# Patient Record
Sex: Male | Born: 1970 | Race: White | Hispanic: No | Marital: Married | Smoking: Current every day smoker
Health system: Southern US, Community
[De-identification: ages and names within clinical notes are randomized; demographics above are authoritative.]

## PROBLEM LIST (undated history)

## (undated) DIAGNOSIS — K589 Irritable bowel syndrome without diarrhea: Secondary | ICD-10-CM

## (undated) HISTORY — PX: VASECTOMY: SHX75

## (undated) HISTORY — PX: HERNIA REPAIR: SHX51

## (undated) HISTORY — PX: OTHER SURGICAL HISTORY: SHX169

---

## 2002-04-08 ENCOUNTER — Emergency Department (HOSPITAL_COMMUNITY): Admission: EM | Admit: 2002-04-08 | Discharge: 2002-04-08 | Payer: Self-pay | Admitting: Emergency Medicine

## 2002-04-09 ENCOUNTER — Emergency Department (HOSPITAL_COMMUNITY): Admission: EM | Admit: 2002-04-09 | Discharge: 2002-04-09 | Payer: Self-pay | Admitting: *Deleted

## 2006-10-01 ENCOUNTER — Encounter (INDEPENDENT_AMBULATORY_CARE_PROVIDER_SITE_OTHER): Payer: Self-pay | Admitting: Specialist

## 2006-10-01 ENCOUNTER — Ambulatory Visit (HOSPITAL_BASED_OUTPATIENT_CLINIC_OR_DEPARTMENT_OTHER): Admission: RE | Admit: 2006-10-01 | Discharge: 2006-10-01 | Payer: Self-pay | Admitting: Urology

## 2008-04-10 ENCOUNTER — Ambulatory Visit: Payer: Self-pay | Admitting: Occupational Medicine

## 2008-04-10 DIAGNOSIS — S20219A Contusion of unspecified front wall of thorax, initial encounter: Secondary | ICD-10-CM

## 2008-04-13 ENCOUNTER — Telehealth (INDEPENDENT_AMBULATORY_CARE_PROVIDER_SITE_OTHER): Payer: Self-pay | Admitting: Occupational Medicine

## 2010-12-26 NOTE — Op Note (Signed)
NAME:  Chad Moon, Chad Moon                 ACCOUNT NO.:  [id number]   MEDICAL RECORD NO.:  [id number]          PATIENT TYPE:  AMB   LOCATION:  [facility]                         FACILITY:  [facility]   PHYSICIAN:  Mark C. Vernie Ammons, M.D.  DATE OF BIRTH:  02/21/1971   DATE OF PROCEDURE:  10/01/2006  DATE OF DISCHARGE:                               OPERATIVE REPORT   PREOPERATIVE DIAGNOSES:  1. Bilateral tender vasal nodules.  2. Penile shaft skin lesion.  3. Subcutaneous penile skin lesions.   POSTOPERATIVE DIAGNOSES:  1. Bilateral tender vasal nodules.  2. Penile shaft skin lesion.  3. Subcutaneous penile skin lesions.   PROCEDURE:  Scrotal exploration, excision of bilateral vasal segments,  excision of penile skin lesion and excision of subcutaneous penile  lesions.   SURGEON:  Dr. Ihor Gully.   ANESTHESIA:  General with local supplement.   SPECIMENS:  Right and left vas segments including clips from prior  vasectomy, penile subcu lesions and penile skin lesion to pathology.   BLOOD LOSS:  Minimal.   COMPLICATIONS:  None.   INDICATIONS:  The patient is a 40 year old white male who had undergone  a previous vasectomy in April 2007.  Clips were applied at the vas and  the patient continued to have discomfort in the area where the clips  were applied.  The clips were palpable and he was tender mainly on the  right-hand side but also on the left.  This was worse with erection and  intercourse at which time he would have bilateral groin discomfort as  well.  He also notes when he has an erection there are two areas on the  penis that are firm and uncomfortable.  He has never had trauma and  recalls these have been present for about a year and half.  He is  brought to the OR today for excision of these lesions and understands  the risks, complications and alternatives.   DESCRIPTION OF OPERATION:  After informed consent, the patient was  brought to the major OR, placed on the table,  administered general  anesthesia in the supine position.  Genitalia was sterilely prepped and  draped.  In the flaccid state, I could still feel the area on the dorsum  of the penis approximately 12 o'clock position at the mid shaft.  I made  a longitudinal incision over this location and dissected down to what  appeared to be the nodule which was associated with the dorsal,  superficial vein of the penis.  I dissected this proximally and  distally, ligated it with 4-0 chromic suture and then excised the  segment including the nodule.  Repalpation revealed the nodularity was  no longer present.   I then noted the smaller nodule just to the right of midline and more  distally on the shaft.  I incised longitudinally over this location as  well and noted what appeared to be a branch of the dorsal superficial  vein of the penis. This was treated in identical fashion, isolating a  segment and by ligating this proximally and distally. There were also  two small areas of discoloration on the shaft of the penis that were  slightly raised and suspicious for possible condyloma.  These were  excised by ellipsing around these and I extended this to include the  previously made incision.  The subcu tissue and skin was then  infiltrated with 0.5% plain Marcaine and the incisions were closed with  running 4-0 chromic suture.   Attention was then directed to the right hemiscrotum.  I was able to  palpate the area of the previous vasectomy and the clip that was easily  palpable was noted.  I made a longitudinal incision over this and then  placed an Allis clamp around the vas at the location of the clips.  I  dissected proximally and distally along the vas and then ligated the vas  both proximally and distally with 3-0 silk suture.  The vas was divided  between these two silk ties and I then dissected the segment containing  the clips free from the surrounding tissue and sent this to pathology.  All  bleeding points were cauterized. I then turned my attention to the  left side where this same procedure was performed in an identical  fashion.  The vas was ligated in identical fashion and the segment  excised and also sent to pathology.  Of note there seemed to be several  clips on the left-hand side.  Bleeding points were cauterized as well  and I infiltrated the skin and subcu tissue and around the vas  proximally and distally on both sides with 0.5% plain Marcaine.  I then  closed the skin of each incision with running 4-0 chromic suture.  Neosporin and dry gauze was applied and the patient was taken to the  recovery room in stable satisfactory condition.  He tolerated the  procedure well and there were no intraoperative complications.   He will be given a prescription for Vicodin HP #28 and Keflex 500 mg  that will be taken t.i.d. for 5 days.  He will return for follow-up in  my office in 2 weeks.      Mark C. Vernie Ammons, M.D.  Electronically Signed     MCO/MEDQ  D:  10/01/2006  T:  10/01/2006  Job:  [id number]

## 2010-12-26 NOTE — Consult Note (Signed)
NAME:  Chad Moon, Chad Moon NO.:  [id number]   MEDICAL RECORD NO.:  [id number]                   PATIENT TYPE:  EMS   LOCATION:  ED                                   FACILITY:  [facility]   PHYSICIAN:  Dirk Dress. Katrinka Blazing, M.D.                DATE OF BIRTH:  [birth year]   DATE OF CONSULTATION:  DATE OF DISCHARGE:                                   CONSULTATION   HISTORY OF PRESENT ILLNESS:  A 40 year old male seen for followup in the  emergency room for evaluation of anal pain and hemorrhoid disease.  The  patient was seen in the emergency room yesterday for similar problems.  He  gives a history of having onset of diarrhea about four days ago.  With the  diarrhea, he developed some perianal swelling and had some intermittent  bleeding.  This became more severe.  The patient is employed as a long-  Secondary school teacher and has had multiple problems with hemorrhoids related  to this job.  He states that his children also have difficulty with diarrhea  and they have been febrile over the past week or so.  He resides in [place]  and is visiting here with his grandparents.  When seen in the emergency room  yesterday, he was in moderate distress with diarrhea and had some perianal  swelling.  He was given Demerol and Phenergan, started on metronidazole,  given hydrocortisone suppositories, and Vicodin for pain.  He is followed up  today for closer evaluation.  He states that he is feeling better today then  he felt from yesterday but he still has significant pain.   PAST MEDICAL HISTORY:  Totally unremarkable except as related to the history  of present illness.  He has no major medical problems.  He has no history of  surgical diseases.   PHYSICAL EXAMINATION:  VITAL SIGNS:  Vital signs were stable.  Temperature  99.3 degrees.  HEENT:  Unremarkable.  NECK:  Supple.  CHEST:  Clear.  HEART:  Regular rate and rhythm without a murmur, gallop, or rub.  ABDOMEN:  Soft  with minimal epigastric tenderness.  Good active bowel  sounds.  RECTAL:  Mild swelling of two hemorrhoidal complexes.  He has a posterior  midline anal fissure.  There is no sentinel polyp.  The remainder of his  exam is unremarkable.   LABORATORY DATA:  No labs were done today.   IMPRESSION:  1. Gastroenteritis, improving.  2. Hemorrhoid disease, chronic, with recent exacerbation.  3. Chronic anal fissure.   RECOMMENDATIONS:  The patient is to continue on his present medications.  He  is advised to continue the full course of Flagyl and he is given a refill on  the suppositories.  He is advised to use Imodium 2 mg two tablets four  times a day until his bowel movements have improved.  He is advised  not to  allow himself to become constipated.  He is advised to add more roughage and  fruits and grains to his diet and to seek the evaluation by a surgeon when  he gets back to [place].                                               Dirk Dress. Katrinka Blazing, M.D.    LCS/MEDQ  D:  04/09/2002  T:  04/10/2002  Job:  [id number]

## 2011-07-08 ENCOUNTER — Encounter: Payer: Self-pay | Admitting: Emergency Medicine

## 2011-07-08 ENCOUNTER — Emergency Department (INDEPENDENT_AMBULATORY_CARE_PROVIDER_SITE_OTHER)
Admission: EM | Admit: 2011-07-08 | Discharge: 2011-07-08 | Disposition: A | Discharge status: Home / Self Care | Payer: 59 | Source: Home / Self Care | Attending: Family Medicine | Admitting: Family Medicine

## 2011-07-08 DIAGNOSIS — S238XXA Sprain of other specified parts of thorax, initial encounter: Secondary | ICD-10-CM

## 2011-07-08 DIAGNOSIS — S29011A Strain of muscle and tendon of front wall of thorax, initial encounter: Secondary | ICD-10-CM

## 2011-07-08 DIAGNOSIS — S239XXA Sprain of unspecified parts of thorax, initial encounter: Secondary | ICD-10-CM

## 2011-07-08 DIAGNOSIS — S2341XA Sprain of ribs, initial encounter: Secondary | ICD-10-CM

## 2011-07-08 MED ORDER — CYCLOBENZAPRINE HCL 10 MG PO TABS: ORAL_TABLET | ORAL | Status: AC

## 2011-07-08 NOTE — ED Notes (Signed)
Left shoulder pain yesterday at work heard crunching sound in left neck and shoulder while pulling on a hose.Burning Pain radiates into arm, chest and left hip.

## 2011-07-12 NOTE — ED Provider Notes (Signed)
History     CSN: [id number] Arrival date & time: 07/08/2011  6:21 PM   First MD Initiated Contact with Patient 07/08/11 1833      Chief Complaint  Patient presents with  . Shoulder Pain     HPI Comments: Patient was loading a heavy fuel hose on to his truck yesterday when he felt pain in his left shoulder.  He has now gradually developed soreness in his left lateral chest whenever he moves his left shoulder.  Patient is a 40 y.o. male presenting with shoulder pain. The history is provided by the patient.  Shoulder Pain This is a new problem. The current episode started yesterday. The problem occurs constantly. The problem has been gradually worsening. Associated symptoms include shortness of breath. Pertinent negatives include no chest pain, no abdominal pain and no headaches. The symptoms are aggravated by twisting and exertion. The symptoms are relieved by nothing. Treatments tried: Aleve. The treatment provided mild relief.    History reviewed. No pertinent past medical history.  Past Surgical History  Procedure Date  . Hernia repair   . Vasectomy   . Hemroidectomy     History reviewed. No pertinent family history.  History  Substance Use Topics  . Smoking status: Current Everyday Smoker -- 1.0 packs/day for 25 years    Types: Cigarettes  . Smokeless tobacco: Not on file  . Alcohol Use: Yes      Review of Systems  Constitutional: Negative.   HENT: Negative.   Eyes: Negative.   Respiratory: Positive for chest tightness and shortness of breath. Negative for wheezing.   Cardiovascular: Negative for chest pain.  Gastrointestinal: Negative.  Negative for abdominal pain.  Genitourinary: Negative.   Musculoskeletal:       Complains of left upper back and neck pain   Skin: Negative.   Neurological: Negative for headaches.    Allergies  Review of patient's allergies indicates no known allergies.  Home Medications   Current Outpatient Rx  Name Route Sig  Dispense Refill  . DICYCLOMINE HCL 10 MG PO CAPS Oral Take 10 mg by mouth 4 (four) times daily -  before meals and at bedtime.      Marland Kitchen LOPERAMIDE HCL 2 MG PO CAPS Oral Take 2 mg by mouth 4 (four) times daily as needed.      Marland Kitchen OMEPRAZOLE 40 MG PO CPDR Oral Take 40 mg by mouth daily.      . CYCLOBENZAPRINE HCL 10 MG PO TABS  Take one tab by mouth daily at bedtime for muscle pain 15 tablet 1    BP 125/80  Pulse 83  Temp(Src) 98.6 F (37 C) (Oral)  Resp 16  Ht 5\' 8"  (1.727 m)  Wt 188 lb (85.276 kg)  BMI 28.59 kg/m2  Physical Exam  Nursing note and vitals reviewed. Constitutional: He is oriented to person, place, and time. He appears well-developed and well-nourished. No distress.  HENT:  Head: Normocephalic.  Mouth/Throat: Oropharynx is clear and moist.  Eyes: Conjunctivae are normal. Pupils are equal, round, and reactive to light.  Neck: Normal range of motion. Neck supple. No thyromegaly present.  Cardiovascular: Normal rate, regular rhythm and normal heart sounds.   Pulmonary/Chest: Effort normal and breath sounds normal. No respiratory distress. He has no wheezes. He has no rales. He exhibits tenderness.  Abdominal: Soft. There is no tenderness.  Musculoskeletal: He exhibits no edema.       Back:       There is tenderness in the left  trapezius muscle, and tenderness along medial and inferior edges of left scapula. Patient also has tenderness along left intercostal muscles.  Lymphadenopathy:    He has no cervical adenopathy.  Neurological: He is alert and oriented to person, place, and time.  Skin: Skin is warm and dry. No rash noted.  Psychiatric: He has a normal mood and affect.    ED Course  Procedures none      1. Sprain of rhomboid   2. Intercostal muscle strain       MDM   Rx given for Flexeril at bedtime.  Rib belt applied. Take two Aleve tabs every 12 hours with food.  Apply ice pack several times daily.  Wear rib belt.  Begin range of motion exercises in  about 5 days ([facility] information and instruction handout given)   Recommend followup with Sports Medicine Clinic if not improving about two weeks.       Donna Christen, MD 07/12/11 [phone]

## 2012-06-30 ENCOUNTER — Emergency Department (INDEPENDENT_AMBULATORY_CARE_PROVIDER_SITE_OTHER)
Admission: EM | Admit: 2012-06-30 | Discharge: 2012-06-30 | Disposition: A | Discharge status: Home / Self Care | Payer: 59 | Source: Home / Self Care

## 2012-06-30 ENCOUNTER — Ambulatory Visit (INDEPENDENT_AMBULATORY_CARE_PROVIDER_SITE_OTHER): Payer: 59 | Admitting: Sports Medicine

## 2012-06-30 ENCOUNTER — Encounter: Payer: Self-pay | Admitting: *Deleted

## 2012-06-30 DIAGNOSIS — M25519 Pain in unspecified shoulder: Secondary | ICD-10-CM

## 2012-06-30 DIAGNOSIS — J069 Acute upper respiratory infection, unspecified: Secondary | ICD-10-CM

## 2012-06-30 DIAGNOSIS — J449 Chronic obstructive pulmonary disease, unspecified: Secondary | ICD-10-CM

## 2012-06-30 DIAGNOSIS — M25511 Pain in right shoulder: Secondary | ICD-10-CM | POA: Insufficient documentation

## 2012-06-30 HISTORY — DX: Irritable bowel syndrome, unspecified: K58.9

## 2012-06-30 MED ORDER — PREDNISONE 50 MG PO TABS: ORAL_TABLET | ORAL | Status: DC

## 2012-06-30 MED ORDER — MELOXICAM 15 MG PO TABS: ORAL_TABLET | ORAL | Status: AC

## 2012-06-30 MED ORDER — DOXYCYCLINE HYCLATE 100 MG PO CAPS: 100.0000 mg | ORAL_CAPSULE | Freq: Two times a day (BID) | ORAL | Status: AC

## 2012-06-30 MED ORDER — METHYLPREDNISOLONE SODIUM SUCC 125 MG IJ SOLR
125.0000 mg | Freq: Once | INTRAMUSCULAR | Status: AC
  Administered 2012-06-30: 125 mg via INTRAMUSCULAR

## 2012-06-30 NOTE — ED Provider Notes (Signed)
History     CSN: [id number]  Arrival date & time 06/30/12  1518   First MD Initiated Contact with Patient 06/30/12 1526      Chief Complaint  Patient presents with  . URI  . Shoulder Pain    HPI  URI Symptoms Onset: 2-3 days  Description: rhinorrhea, nasal congestion, cough, mild SOB and wheezing  Modifying factors:  + 1.25 PPD smoker  Symptoms Nasal discharge: yes Fever: n Sore throat: no Cough: yes Wheezing: yes Ear pain: no GI symptoms: no Sick contacts: no  Red Flags  Stiff neck: no Dyspnea: mild Rash: no Swallowing difficulty: no  Sinusitis Risk Factors Headache/face pain: no Double sickening: no tooth pain: no  Allergy Risk Factors Sneezing: no Itchy scratchy throat: no Seasonal symptoms: no  Flu Risk Factors Headache: no muscle aches: no severe fatigue: no  Pt also presenting with shoulder pain. This has been a chronic issue. 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 Had similar flare of shoulder pain last year. Predominantly R sided. Aching. Pain worse with overhead movements. No radicular sxs. No distal arm numbness.    Past Medical History  Diagnosis Date  . IBS (irritable bowel syndrome)     Past Surgical History  Procedure Date  . Hernia repair   . Vasectomy   . Hemroidectomy     No family history on file.  History  Substance Use Topics  . Smoking status: Current Every Day Smoker -- 1.0 packs/day for 25 years    Types: Cigarettes  . Smokeless tobacco: Not on file  . Alcohol Use: No      Review of Systems  All other systems reviewed and are negative.    Allergies  Review of patient's allergies indicates no known allergies.  Home Medications   Current Outpatient Rx  Name  Route  Sig  Dispense  Refill  . CYCLOBENZAPRINE HCL 10 MG PO TABS      Take one tab by mouth daily at bedtime for muscle pain   15 tablet   1   . DICYCLOMINE HCL 10 MG PO CAPS   Oral   Take 10 mg by  mouth 4 (four) times daily -  before meals and at bedtime.           Marland Kitchen DOXYCYCLINE HYCLATE 100 MG PO CAPS   Oral   Take 1 capsule (100 mg total) by mouth 2 (two) times daily.   14 capsule   0   . LOPERAMIDE HCL 2 MG PO CAPS   Oral   Take 2 mg by mouth 4 (four) times daily as needed.           Marland Kitchen OMEPRAZOLE 40 MG PO CPDR   Oral   Take 40 mg by mouth daily.           Marland Kitchen PREDNISONE 50 MG PO TABS      1 tab daily x 5 days   5 tablet   0     BP 126/83  Pulse 78  Temp 98.1 F (36.7 C) (Oral)  Resp 16  Ht 5\' 8"  (1.727 m)  SpO2 98%  Physical Exam  Constitutional: He appears well-developed and well-nourished.  HENT:  Head: Normocephalic and atraumatic.  Right Ear: External ear normal.  Left Ear: External ear normal.       +nasal erythema, rhinorrhea bilaterally, + post oropharyngeal erythema    Eyes: Conjunctivae normal are normal. Pupils are equal, round,  and reactive to light.  Neck: Normal range of motion. Neck supple.  Cardiovascular: Normal rate and regular rhythm.   Pulmonary/Chest: Effort normal. He has wheezes.  Abdominal: Soft. Bowel sounds are normal.  Musculoskeletal:       + mild TTP along R post trapezius.  Shoulder: full ROM, mild pain w/ external rotation, empty can positive.   Neurological: He is alert.  Skin: Skin is warm.    ED Course  Procedures (including critical care time)  Labs Reviewed - No data to display No results found.   1. URI (upper respiratory infection)   2. Obstructive lung disease   3. Shoulder pain       MDM  Viral induced obstructive lung disease exacerbation.  Solumedrol 125mg  IM x1.  Prednisone and doxy.  Discussed infectious and resp red flags.  Discussed smoking cessation.  Will also likely need formal PFTs 4-6 weeks after resolution of current illness.   Shoulder pain:  Chronic issue.  Will consults sports medicine.  Treatment plan per sports medicine.     The patient and/or caregiver has been  counseled thoroughly with regard to treatment plan and/or medications prescribed including dosage, schedule, interactions, rationale for use, and possible side effects and they verbalize understanding. Diagnoses and expected course of recovery discussed and will return if not improved as expected or if the condition worsens. Patient and/or caregiver verbalized understanding.             Doree Albee, MD 06/30/12 [phone]

## 2012-06-30 NOTE — Assessment & Plan Note (Signed)
For decades, highly suggestive of supraspinatus tendinopathy. Mobic, formal physical therapy. Home exercises. He'll see me back in 4 weeks, injection if no better.

## 2012-06-30 NOTE — Progress Notes (Signed)
SPORTS MEDICINE CONSULTATION REPORT  Subjective:    I'm seeing this patient as a consultation for:  Dr. Alvester Morin  CC: Right shoulder pain  HPI: This is a very pleasant 41 year old male who comes in with a decades of pain that he localizes in his right shoulder over the deltoid. He notes it's worse when reaching overhead, wakes him up at night, is localized, does not radiate, no pain in his neck, no numbness or tingling in the fingertips. It is moderate, and interferes with his activities of daily living.  Past medical history, Surgical history, Family history, Social history, Allergies, and medications have been entered into the medical record, reviewed, and no changes needed.   Review of Systems: No headache, visual changes, nausea, vomiting, diarrhea, constipation, dizziness, abdominal pain, skin rash, fevers, chills, night sweats, weight loss, swollen lymph nodes, body aches, joint swelling, muscle aches, chest pain, or shortness of breath.   Objective:   Vitals:  Afebrile, vital signs stable. General: Well Developed, well nourished, and in no acute distress.  Neuro/Psych: Alert and oriented x3, extra-ocular muscles intact, able to move all 4 extremities.  Skin: Warm and dry, no rashes noted.  Respiratory: Not using accessory muscles, speaking in full sentences, trachea midline.  Cardiovascular: Pulses palpable, no extremity edema. Abdomen: Does not appear distended. Right Shoulder: Inspection reveals no abnormalities, atrophy or asymmetry. Palpation is normal with no tenderness over AC joint or bicipital groove. ROM is full in all planes. Rotator cuff strength weak to supraspinatus. Positive Neer's, Hawkin's, and Empty Can tests. Speeds and Yergason's tests normal. No labral pathology noted with negative Obrien's, negative clunk and good stability. Normal scapular function observed. No painful arc and no drop arm sign. No apprehension sign  Impression and Recommendations:   This  case required medical decision making of moderate complexity.

## 2012-06-30 NOTE — ED Notes (Signed)
Patient c/o sneezing, sinus drainage x 2 days. Denies fever. He also c/o right shoulder and upper back pain, chronic. I told him sports Med will be the most appropriate to follow him for his muskuloseletal issues.

## 2012-07-15 ENCOUNTER — Ambulatory Visit: Payer: 59 | Attending: Sports Medicine | Admitting: Physical Therapy

## 2012-07-15 DIAGNOSIS — IMO0001 Reserved for inherently not codable concepts without codable children: Secondary | ICD-10-CM | POA: Insufficient documentation

## 2012-07-15 DIAGNOSIS — M6281 Muscle weakness (generalized): Secondary | ICD-10-CM | POA: Insufficient documentation

## 2012-07-15 DIAGNOSIS — M2550 Pain in unspecified joint: Secondary | ICD-10-CM | POA: Insufficient documentation

## 2012-07-21 ENCOUNTER — Ambulatory Visit: Payer: 59 | Admitting: Physical Therapy

## 2012-07-25 ENCOUNTER — Ambulatory Visit: Payer: 59 | Admitting: Physical Therapy

## 2012-07-28 ENCOUNTER — Ambulatory Visit: Payer: 59 | Admitting: Physical Therapy

## 2012-08-01 ENCOUNTER — Ambulatory Visit: Payer: 59 | Admitting: Physical Therapy

## 2012-08-04 ENCOUNTER — Encounter: Payer: 59 | Admitting: Physical Therapy

## 2012-08-08 ENCOUNTER — Ambulatory Visit: Payer: 59 | Admitting: Physical Therapy

## 2012-08-17 ENCOUNTER — Encounter: Payer: 59 | Admitting: Physical Therapy

## 2012-08-18 ENCOUNTER — Encounter: Payer: 59 | Admitting: Physical Therapy

## 2012-08-19 ENCOUNTER — Ambulatory Visit (HOSPITAL_COMMUNITY)
Admission: RE | Admit: 2012-08-19 | Discharge: 2012-08-19 | Disposition: A | Discharge status: Home / Self Care | Payer: 59 | Source: Ambulatory Visit | Attending: General Surgery | Admitting: General Surgery

## 2012-08-19 ENCOUNTER — Other Ambulatory Visit (HOSPITAL_COMMUNITY): Payer: Self-pay | Admitting: General Surgery

## 2012-08-19 DIAGNOSIS — R1011 Right upper quadrant pain: Secondary | ICD-10-CM

## 2012-08-25 ENCOUNTER — Encounter: Payer: 59 | Admitting: Physical Therapy

## 2012-09-01 ENCOUNTER — Other Ambulatory Visit (HOSPITAL_COMMUNITY): Payer: Self-pay | Admitting: General Surgery

## 2012-09-01 ENCOUNTER — Ambulatory Visit: Payer: 59 | Attending: Sports Medicine | Admitting: Physical Therapy

## 2012-09-01 DIAGNOSIS — R110 Nausea: Secondary | ICD-10-CM

## 2012-09-01 DIAGNOSIS — M6281 Muscle weakness (generalized): Secondary | ICD-10-CM | POA: Insufficient documentation

## 2012-09-01 DIAGNOSIS — R109 Unspecified abdominal pain: Secondary | ICD-10-CM

## 2012-09-01 DIAGNOSIS — M2550 Pain in unspecified joint: Secondary | ICD-10-CM | POA: Insufficient documentation

## 2012-09-01 DIAGNOSIS — IMO0001 Reserved for inherently not codable concepts without codable children: Secondary | ICD-10-CM | POA: Insufficient documentation

## 2012-09-05 ENCOUNTER — Encounter (HOSPITAL_COMMUNITY): Payer: Self-pay

## 2012-09-05 ENCOUNTER — Ambulatory Visit (HOSPITAL_COMMUNITY)
Admission: RE | Admit: 2012-09-05 | Discharge: 2012-09-05 | Disposition: A | Discharge status: Home / Self Care | Payer: 59 | Source: Ambulatory Visit | Attending: General Surgery | Admitting: General Surgery

## 2012-09-05 DIAGNOSIS — R110 Nausea: Secondary | ICD-10-CM | POA: Insufficient documentation

## 2012-09-05 DIAGNOSIS — R109 Unspecified abdominal pain: Secondary | ICD-10-CM

## 2012-09-05 MED ORDER — TECHNETIUM TC 99M MEBROFENIN IV KIT
5.0000 | PACK | Freq: Once | INTRAVENOUS | Status: AC | PRN
  Administered 2012-09-05: 5 via INTRAVENOUS

## 2012-09-08 ENCOUNTER — Ambulatory Visit: Payer: 59 | Admitting: Physical Therapy

## 2012-11-03 ENCOUNTER — Other Ambulatory Visit: Payer: Self-pay | Admitting: Sports Medicine

## 2012-11-18 ENCOUNTER — Other Ambulatory Visit (HOSPITAL_COMMUNITY): Payer: Self-pay | Admitting: General Surgery

## 2012-11-18 DIAGNOSIS — R197 Diarrhea, unspecified: Secondary | ICD-10-CM

## 2012-11-18 DIAGNOSIS — R109 Unspecified abdominal pain: Secondary | ICD-10-CM

## 2012-11-22 ENCOUNTER — Ambulatory Visit (HOSPITAL_COMMUNITY)
Admission: RE | Admit: 2012-11-22 | Discharge: 2012-11-22 | Disposition: A | Discharge status: Home / Self Care | Payer: 59 | Source: Ambulatory Visit | Attending: General Surgery | Admitting: General Surgery

## 2012-11-22 DIAGNOSIS — R197 Diarrhea, unspecified: Secondary | ICD-10-CM

## 2012-11-22 DIAGNOSIS — R109 Unspecified abdominal pain: Secondary | ICD-10-CM | POA: Insufficient documentation

## 2012-11-22 DIAGNOSIS — R933 Abnormal findings on diagnostic imaging of other parts of digestive tract: Secondary | ICD-10-CM | POA: Insufficient documentation

## 2012-11-22 MED ORDER — IOHEXOL 300 MG/ML  SOLN
100.0000 mL | Freq: Once | INTRAMUSCULAR | Status: AC | PRN
  Administered 2012-11-22: 100 mL via INTRAVENOUS

## 2012-11-25 ENCOUNTER — Other Ambulatory Visit (HOSPITAL_COMMUNITY): Payer: 59

## 2012-11-29 ENCOUNTER — Encounter: Payer: Self-pay | Admitting: *Deleted

## 2012-11-29 ENCOUNTER — Emergency Department (INDEPENDENT_AMBULATORY_CARE_PROVIDER_SITE_OTHER): Payer: 59

## 2012-11-29 ENCOUNTER — Emergency Department (INDEPENDENT_AMBULATORY_CARE_PROVIDER_SITE_OTHER)
Admission: EM | Admit: 2012-11-29 | Discharge: 2012-11-29 | Disposition: A | Discharge status: Home / Self Care | Payer: 59 | Source: Home / Self Care | Attending: Family Medicine | Admitting: Family Medicine

## 2012-11-29 DIAGNOSIS — R0989 Other specified symptoms and signs involving the circulatory and respiratory systems: Secondary | ICD-10-CM

## 2012-11-29 DIAGNOSIS — R490 Dysphonia: Secondary | ICD-10-CM

## 2012-11-29 DIAGNOSIS — J309 Allergic rhinitis, unspecified: Secondary | ICD-10-CM

## 2012-11-29 DIAGNOSIS — J209 Acute bronchitis, unspecified: Secondary | ICD-10-CM

## 2012-11-29 DIAGNOSIS — J3089 Other allergic rhinitis: Secondary | ICD-10-CM

## 2012-11-29 DIAGNOSIS — R059 Cough, unspecified: Secondary | ICD-10-CM

## 2012-11-29 DIAGNOSIS — R05 Cough: Secondary | ICD-10-CM

## 2012-11-29 MED ORDER — FLUTICASONE PROPIONATE 50 MCG/ACT NA SUSP: 2.0000 | Freq: Every day | NASAL | Status: AC

## 2012-11-29 MED ORDER — DOXYCYCLINE HYCLATE 100 MG PO CAPS: 100.0000 mg | ORAL_CAPSULE | Freq: Two times a day (BID) | ORAL | Status: AC

## 2012-11-29 NOTE — ED Provider Notes (Signed)
History     CSN: [id number]  Arrival date & time 11/29/12  1733   First MD Initiated Contact with Patient 11/29/12 1758      Chief Complaint  Patient presents with  . Nasal Congestion  . Headache      HPI Comments: Patient complains of onset of sneezing and sinus congestion about one week ago, followed by a mild sore throat.  Three days ago he developed myalgias, increased congestion, frontal headache, fatigue, and chills.   He has now developed a mild cough.  He also complains of persistent hoarseness for about 6 months that has actually improved slightly over the past several days.  He continues to smoke.  The history is provided by the patient.    Past Medical History  Diagnosis Date  . IBS (irritable bowel syndrome)     Past Surgical History  Procedure Laterality Date  . Hernia repair    . Vasectomy    . Hemroidectomy      History reviewed. No pertinent family history.  History  Substance Use Topics  . Smoking status: Current Every Day Smoker -- 1.00 packs/day for 25 years    Types: Cigarettes  . Smokeless tobacco: Not on file  . Alcohol Use: No      Review of Systems + sore throat + cough No pleuritic pain No wheezing + nasal congestion + post-nasal drainage + sinus pain/pressure No itchy/red eyes No earache No hemoptysis No SOB No fever, + chills No nausea No vomiting No abdominal pain No diarrhea No urinary symptoms No skin rashes + fatigue No  myalgias + headache + hoarseness Used OTC meds without relief  Allergies  Review of patient's allergies indicates no known allergies.  Home Medications   Current Outpatient Rx  Name  Route  Sig  Dispense  Refill  . cyclobenzaprine (FLEXERIL) 10 MG tablet      Take one tab by mouth daily at bedtime for muscle pain   15 tablet   1   . dicyclomine (BENTYL) 10 MG capsule   Oral   Take 10 mg by mouth 4 (four) times daily -  before meals and at bedtime.           Marland Kitchen doxycycline (VIBRAMYCIN) 100 MG capsule   Oral   Take 1 capsule (100 mg total) by mouth 2 (two) times daily.   20 capsule   0   . loperamide (IMODIUM) 2 MG capsule   Oral   Take 2 mg by mouth 4 (four) times daily as needed.           . meloxicam (MOBIC) 15 MG tablet      One tab PO qAM with breakfast for 2 weeks, then daily prn pain.   30 tablet   3   . omeprazole (PRILOSEC) 40 MG capsule   Oral   Take 40 mg by mouth daily.           . predniSONE (DELTASONE) 50 MG tablet      1 tab daily x 5 days   5 tablet   0     BP 138/86  Pulse 91  Temp(Src) 98.7 F (37.1 C) (Oral)  Ht 5' 8.5" (1.74 m)  Wt 185 lb (83.915 kg)  BMI 27.72 kg/m2  SpO2 97%  Physical Exam Nursing notes and Vital Signs reviewed. Appearance:  Patient appears healthy, stated age, and in no acute distress Eyes:  Pupils are equal, round, and reactive to light and accomodation.  Extraocular movement is intact.  Conjunctivae are not inflamed  Ears:  Canals normal.  Tympanic membranes normal.  Nose:  Mildly congested turbinates.  No sinus tenderness.  Pharynx:  Normal Neck:  Supple.  Slightly tender shotty posterior nodes are palpated bilaterally  Lungs:  Rhonchi heard posteriorly.  Breath sounds are equal.  Heart:  Regular rate and rhythm without murmurs, rubs, or gallops.  Abdomen:  Nontender without masses or hepatosplenomegaly.  Bowel sounds are present.  No CVA or flank tenderness.  Extremities:  No edema.  No calf  tenderness Skin:  No rash present.    ED Course  Procedures  none   Dg Chest 2 View  11/29/2012  *RADIOLOGY REPORT*  Clinical Data: Cough and congestion.  CHEST - 2 VIEW  Comparison: 04/10/2008 chest radiograph  Findings: The cardiomediastinal silhouette is unremarkable. The lungs are clear. COPD/emphysema noted. There is no evidence of focal airspace disease, pulmonary edema, suspicious pulmonary nodule/mass, pleural effusion, or pneumothorax. No acute bony abnormalities are identified.  IMPRESSION: No evidence of active cardiopulmonary disease.  COPD/emphysema.   Original Report Authenticated By: Harmon Pier, M.D.      1. Hoarseness, persistent   2. Acute bronchitis   3. Perennial allergic rhinitis       MDM  Begin doxycycline, and Fluticasone nasal spray.  May resume Tessalon at bedtime prn Take Mucinex D (guaifenesin with decongestant) twice daily for congestion.  Increase fluid intake. May use Afrin nasal spray (or generic oxymetazoline) twice daily for about 5 days.  Also recommend using saline nasal spray several times daily and saline nasal irrigation (AYR is a common brand).  Use prescription nose spray after using Afrin and saline rinse. Stop all antihistamines for now, and other non-prescription cough/cold preparations. Followup with ENT as soon as possible for hoarseness.        Lattie Haw, MD 12/03/12 [phone]

## 2012-11-29 NOTE — ED Notes (Signed)
Pt c/o yellow/green nasal congestion, HA and sinus pressure x 6 days. He reports having a fever yesterday. He has taken Tylenol.

## 2012-12-02 ENCOUNTER — Ambulatory Visit (HOSPITAL_COMMUNITY)
Admission: RE | Admit: 2012-12-02 | Discharge: 2012-12-02 | Disposition: A | Discharge status: Home / Self Care | Payer: 59 | Source: Ambulatory Visit | Attending: General Surgery | Admitting: General Surgery

## 2012-12-02 DIAGNOSIS — R197 Diarrhea, unspecified: Secondary | ICD-10-CM | POA: Insufficient documentation

## 2012-12-02 DIAGNOSIS — R109 Unspecified abdominal pain: Secondary | ICD-10-CM | POA: Insufficient documentation

## 2013-12-28 ENCOUNTER — Emergency Department (HOSPITAL_BASED_OUTPATIENT_CLINIC_OR_DEPARTMENT_OTHER)
Admission: EM | Admit: 2013-12-28 | Discharge: 2013-12-28 | Disposition: A | Discharge status: Home / Self Care | Payer: 59 | Attending: Emergency Medicine | Admitting: Emergency Medicine

## 2013-12-28 ENCOUNTER — Encounter (HOSPITAL_BASED_OUTPATIENT_CLINIC_OR_DEPARTMENT_OTHER): Payer: Self-pay | Admitting: Emergency Medicine

## 2013-12-28 ENCOUNTER — Emergency Department (HOSPITAL_BASED_OUTPATIENT_CLINIC_OR_DEPARTMENT_OTHER): Payer: 59

## 2013-12-28 DIAGNOSIS — Z79899 Other long term (current) drug therapy: Secondary | ICD-10-CM | POA: Insufficient documentation

## 2013-12-28 DIAGNOSIS — Z792 Long term (current) use of antibiotics: Secondary | ICD-10-CM | POA: Insufficient documentation

## 2013-12-28 DIAGNOSIS — S8012XA Contusion of left lower leg, initial encounter: Secondary | ICD-10-CM

## 2013-12-28 DIAGNOSIS — X58XXXA Exposure to other specified factors, initial encounter: Secondary | ICD-10-CM | POA: Insufficient documentation

## 2013-12-28 DIAGNOSIS — IMO0002 Reserved for concepts with insufficient information to code with codable children: Secondary | ICD-10-CM | POA: Insufficient documentation

## 2013-12-28 DIAGNOSIS — S8010XA Contusion of unspecified lower leg, initial encounter: Secondary | ICD-10-CM | POA: Insufficient documentation

## 2013-12-28 DIAGNOSIS — F17200 Nicotine dependence, unspecified, uncomplicated: Secondary | ICD-10-CM | POA: Insufficient documentation

## 2013-12-28 DIAGNOSIS — Y9389 Activity, other specified: Secondary | ICD-10-CM | POA: Insufficient documentation

## 2013-12-28 DIAGNOSIS — K589 Irritable bowel syndrome without diarrhea: Secondary | ICD-10-CM | POA: Insufficient documentation

## 2013-12-28 DIAGNOSIS — Y929 Unspecified place or not applicable: Secondary | ICD-10-CM | POA: Insufficient documentation

## 2013-12-28 DIAGNOSIS — Z791 Long term (current) use of non-steroidal anti-inflammatories (NSAID): Secondary | ICD-10-CM | POA: Insufficient documentation

## 2013-12-28 NOTE — ED Provider Notes (Signed)
CSN: [id number]     Arrival date & time 12/28/13  1753 History   First MD Initiated Contact with Patient 12/28/13 1754     This chart was scribed for non-physician practitioner, Cheron SchaumannLeslie Sofia PA-C working with Layla MawKristen N Ward, DO by Arlan OrganAshley Leger, ED Scribe. This patient was seen in room MH05/MH05 and the patient's care was started at 6:24 PM.  Chief Complaint  Patient presents with  . Leg Pain   The history is provided by the patient.    HPI Comments: Chad Moon is a 43 y.o. male with a PMHx of IBS who presents to the Emergency Department complaining of a painful area of ecchymosis to the upper posterior L lower extremity x 1 day that is progressively worsening. Pt describes this area as "burning" and states he has also noted a "knot" in the same area. He states this pain radiates into the L lower extremity intermittently. Pt also admits to moderate pain to the L calf. He has not tried anything OTC or any home remedies for his symptoms. At this time he denies any fever, chills, weakness, numbness, tingling, or loss of sensation. 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 States last drive was yesterday lasting 8 hours. Pt has a known allergy to Percocet. No other pertinent past medical history. No other concerns this visit.   Past Medical History  Diagnosis Date  . IBS (irritable bowel syndrome)    Past Surgical History  Procedure Laterality Date  . Hernia repair    . Vasectomy    . Hemroidectomy     No family history on file. History  Substance Use Topics  . Smoking status: Current Every Day Smoker -- 1.00 packs/day for 25 years    Types: Cigarettes  . Smokeless tobacco: Not on file  . Alcohol Use: No    Review of Systems  Constitutional: Negative for fever and chills.  HENT: Negative for congestion.   Musculoskeletal: Positive for arthralgias (L lower extremity).  Neurological: Negative for weakness and numbness.   All other systems reviewed and are negative.     Allergies  Percocet  Home Medications   Prior to Admission medications   Medication Sig Start Date End Date Taking? Authorizing Provider  cyclobenzaprine (FLEXERIL) 10 MG tablet Take one tab by mouth daily at bedtime for muscle pain 07/08/11   Lattie HawStephen A Beese, MD  dicyclomine (BENTYL) 10 MG capsule Take 10 mg by mouth 4 (four) times daily -  before meals and at bedtime.      Historical Provider, MD  doxycycline (VIBRAMYCIN) 100 MG capsule Take 1 capsule (100 mg total) by mouth 2 (two) times daily. 11/29/12   Lattie HawStephen A Beese, MD  fluticasone (FLONASE) 50 MCG/ACT nasal spray Place 2 sprays into the nose daily. 11/29/12   Lattie HawStephen A Beese, MD  loperamide (IMODIUM) 2 MG capsule Take 2 mg by mouth 4 (four) times daily as needed.      Historical Provider, MD  meloxicam (MOBIC) 15 MG tablet One tab PO qAM with breakfast for 2 weeks, then daily prn pain. 06/30/12   Monica Bectonhomas J Thekkekandam, MD  omeprazole (PRILOSEC) 40 MG capsule Take 40 mg by mouth daily.      Historical Provider, MD   Triage Vitals: There were no vitals taken for this visit.   Physical Exam  Nursing note and vitals reviewed. Constitutional: He is oriented to person, place, and time. He appears well-developed and well-nourished.  HENT:  Head: Normocephalic and atraumatic.  Eyes: EOM are normal. Pupils are equal, round, and reactive to light.  Neck: Normal range of motion.  Cardiovascular: Normal rate and intact distal pulses.   Good pulse in foot  Pulmonary/Chest: Effort normal.  Abdominal: He exhibits no distension.  Musculoskeletal: Normal range of motion. He exhibits tenderness.  Tenderness to palpation over L upper posterior lower extremity  Neurological: He is alert and oriented to person, place, and time.  Skin: Skin is warm and dry.  10 x 8 cm bruise to the upper posterior L lower extremity  Psychiatric: He has a normal mood and affect.    ED Course  Procedures  (including critical care time)  COORDINATION OF CARE: 6:34 PM- Will order US Venous doppler unilateral L. Discussed treatment plan with pt at bedside and pt agreed to plan.     Labs Review Labs Reviewed - No data to display  Imaging Review No results found.   EKG Interpretation None      MDM doppler no dvt   Final diagnoses:  Contusion of left leg     I personally performed the services in this documentation, which was scribed in my presence.  The recorded information has been reviewed and considered.   Barnet PallKaren SofiaPAC.  I personally performed the services described in this documentation, which was scribed in my presence. The recorded information has been reviewed and is accurate.    Lonia SkinnerLeslie K [id number], PA-C 12/28/13 2120

## 2013-12-28 NOTE — Discharge Instructions (Signed)

## 2013-12-28 NOTE — ED Provider Notes (Signed)
Medical screening examination/treatment/procedure(s) were performed by non-physician practitioner and as supervising physician I was immediately available for consultation/collaboration.   EKG Interpretation None        Layla MawKristen N Rabia Argote, DO 12/28/13 2308

## 2013-12-28 NOTE — ED Notes (Signed)
Patient is resting comfortably. Back from U/S

## 2013-12-28 NOTE — ED Notes (Signed)
Patient transported to Ultrasound 

## 2013-12-28 NOTE — ED Notes (Signed)
Karen Sofia, PA-C at bedside 

## 2013-12-28 NOTE — ED Notes (Signed)
Pt. Reports he has a knot on the back of his leg and is having some pain in the area of concern.  Pt. Reports he sits on his job.

## 2014-05-07 IMAGING — CT CT ABD-PELV W/ CM
2 of 4 series · 16 of 46 positions shown, 18 images · IV contrast (Omnipaque 300)
Comparison: None

CLINICAL DATA: Abdominal pain with diarrhea

CT ABDOMEN AND PELVIS WITH CONTRAST
TECHNIQUE: Multidetector CT imaging of the abdomen and pelvis was
performed following the standard protocol during bolus
administration of intravenous contrast.
Contrast: 100mL OMNIPAQUE IOHEXOL 300 MG/ML  SOLN

[Series 2: abd_pel_with 5.0 b40f · axial · 0.75mm/px · z∈[-461,-61]mm · 13 of 90 slices shown, 15 images]
[im 5/90  soft-tissue]
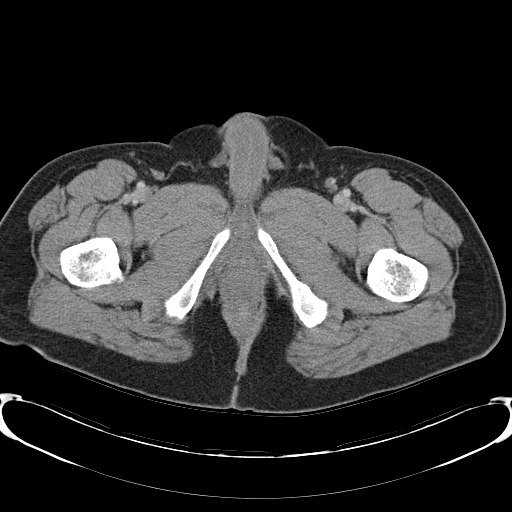
[im 5/90  bone]
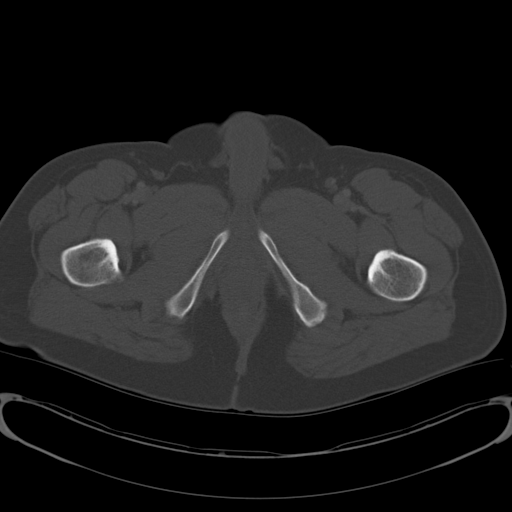
[im 13/90  soft-tissue]
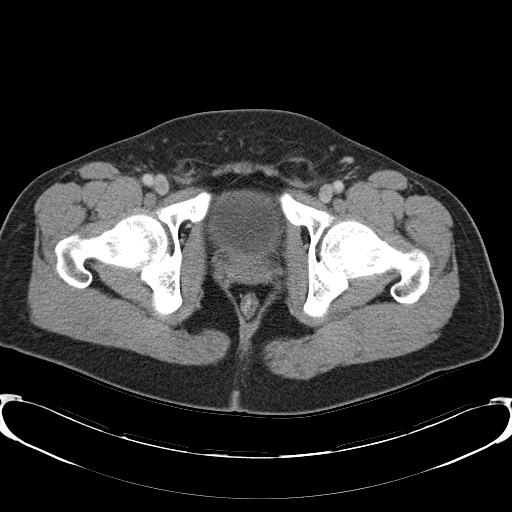
[im 17/90  soft-tissue]
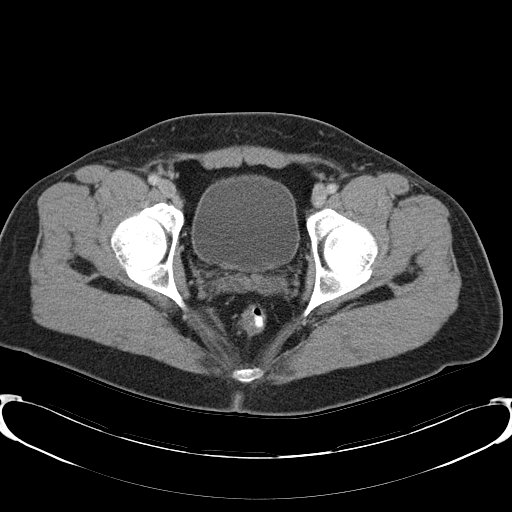
[im 26/90  soft-tissue]
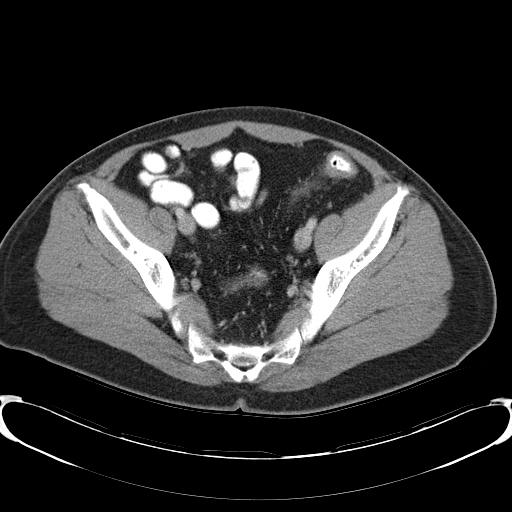
[im 30/90  soft-tissue]
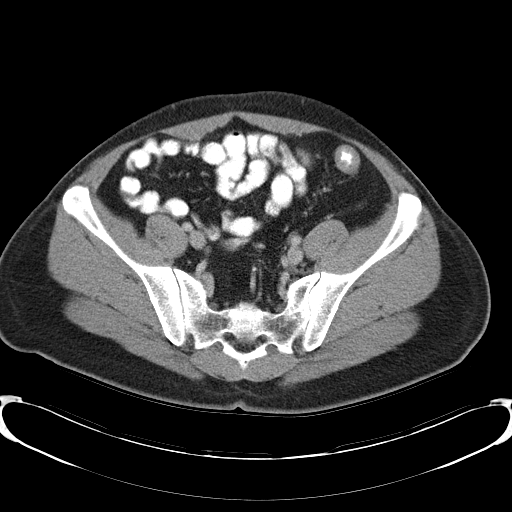
[im 39/90  soft-tissue]
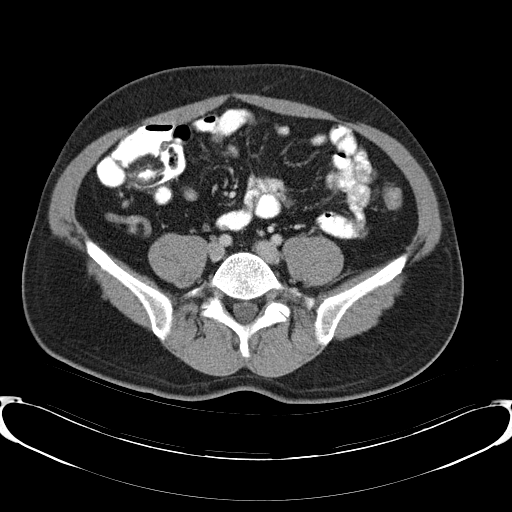
[im 47/90  soft-tissue]
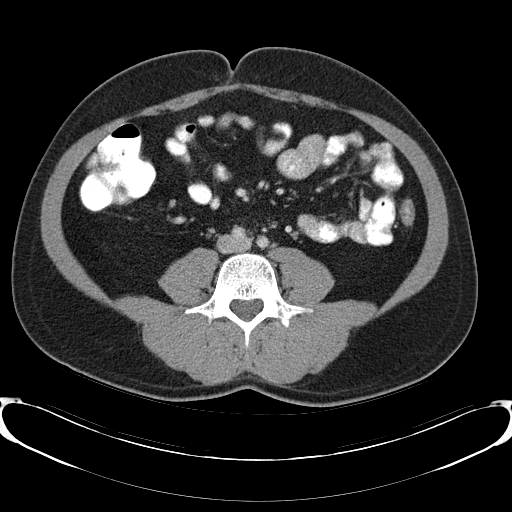
[im 51/90  soft-tissue]
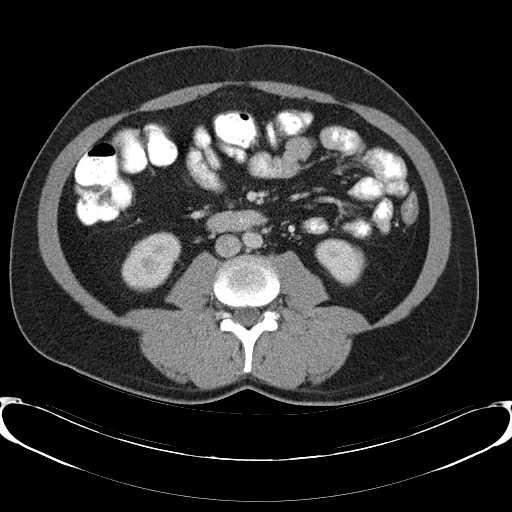
[im 60/90  soft-tissue]
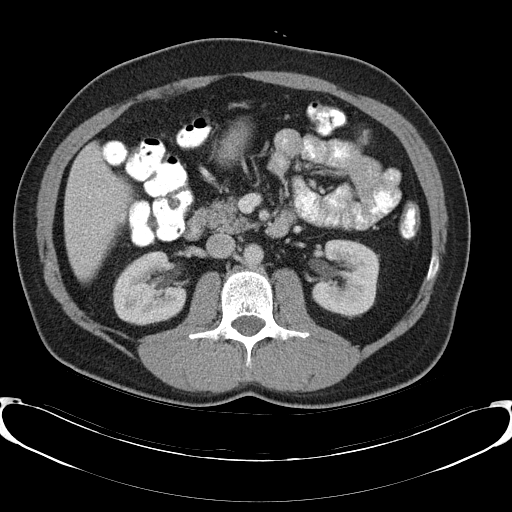
[im 60/90  bone]
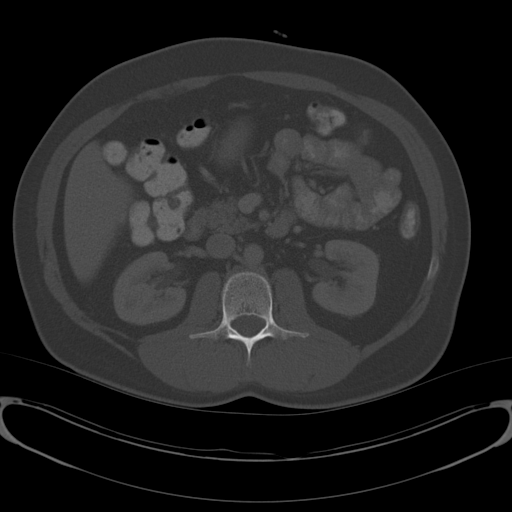
[im 64/90  soft-tissue]
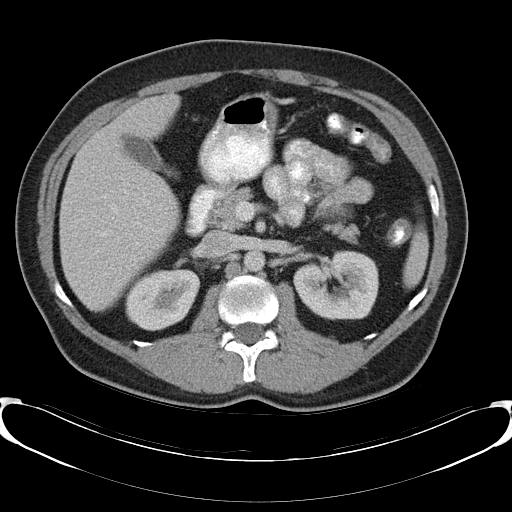
[im 73/90  soft-tissue]
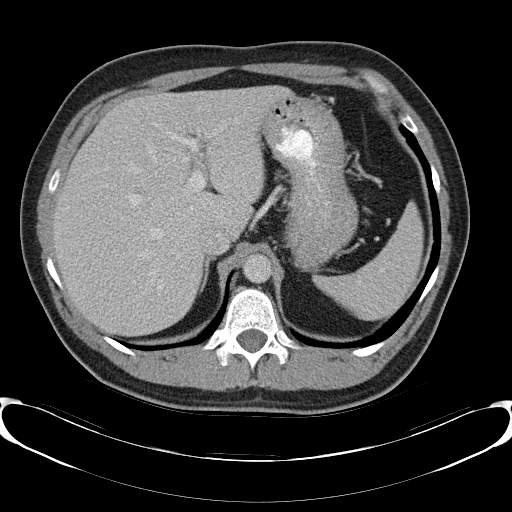
[im 77/90  soft-tissue]
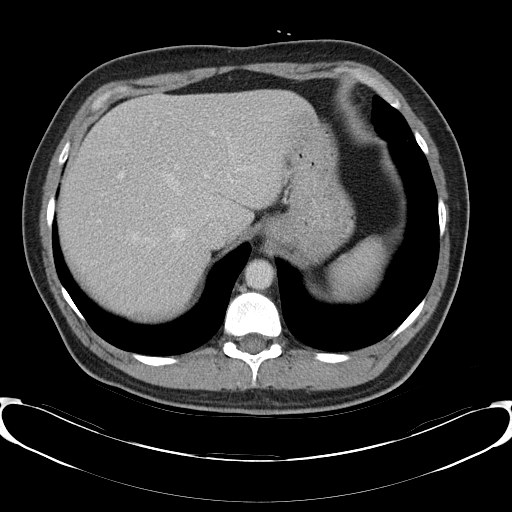
[im 85/90  soft-tissue]
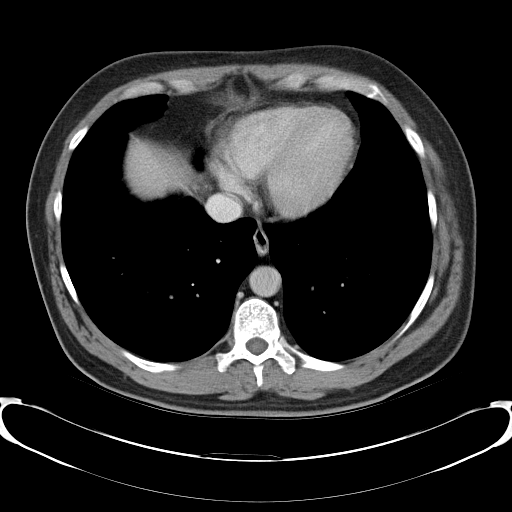

[Series 4: abd_pel_with 3.0 spo cor · coronal · 0.74mm/px · 3 of 81 slices shown]
[im 27/81  soft-tissue]
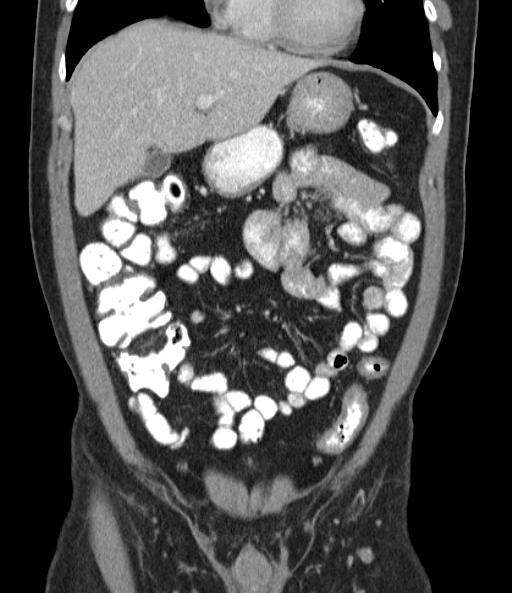
[im 36/81  soft-tissue]
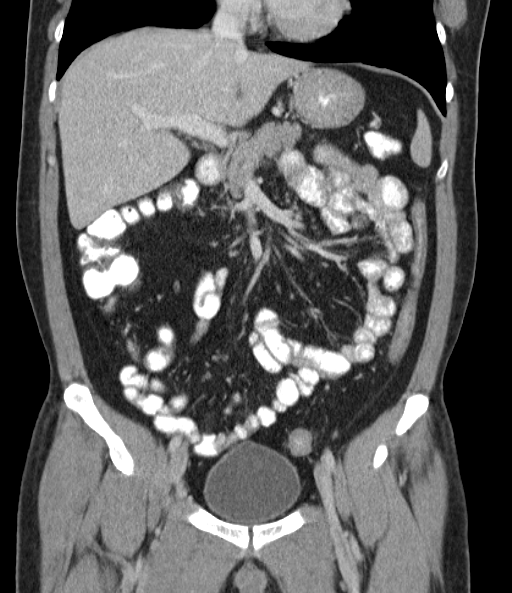
[im 45/81  soft-tissue]
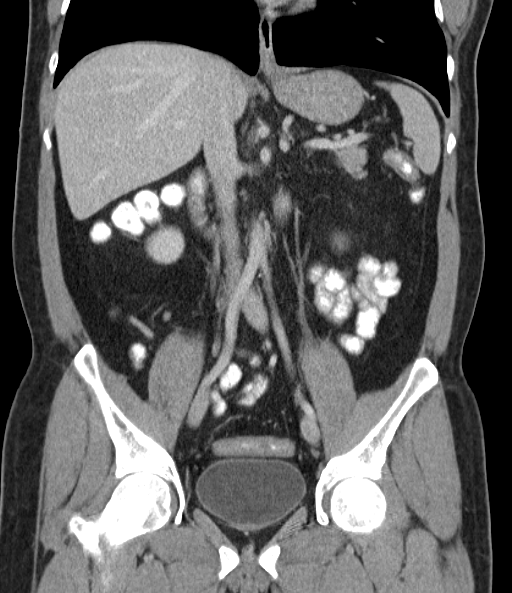

[16 of 46 positions shown; findings below may reference images not displayed]

FINDINGS: Lung bases:  The lung bases appear clear.  There is no
pleural or pericardial effusion identified.  No airspace
consolidation identified.

Abdomen/pelvis:  There is no focal liver abnormality identified.
The gallbladder appears normal.  No biliary dilatation.  The
pancreas is unremarkable.  The spleen is normal.

The adrenal glands both appear normal.  The right kidney is normal.
The left kidney also appears normal.  The urinary bladder is within
normal limits.  Prostate gland and seminal vesicles appear normal.

Normal caliber of the abdominal aorta.  There is no upper abdominal
adenopathy.  No pelvic or inguinal adenopathy.

The stomach appears normal.  The small bowel loops are
unremarkable.  The appendix is visualized and appears normal, image
51/series 2.  Normal appearance of the colon.  Normal appearance of
the proximal colon.  There is mild wall thickening involving the
sigmoid colon.  Mild hyperemia of the vasa recta may be present.
No fat stranding or free fluid identified.  No abnormal fluid
collections identified.

Bones/Musculoskeletal:   Review of the visualized osseous
structures is unremarkable.
IMPRESSION: 1.  There is mild wall thickening of the sigmoid colon.  This is
nonspecific and may reflect incomplete distention.  Mild colitis is
not excluded.
2.  No evidence for bowel obstruction or abscess formation.
3.  Normal appearance of the appendix.

## 2014-05-14 IMAGING — CR DG CHEST 2V
2 series · 2 of 2 positions shown · non-contrast
Comparison: 04/10/2008 chest radiograph

CLINICAL DATA: Cough and congestion.

CHEST - 2 VIEW

[view not recorded (1 of 2)]
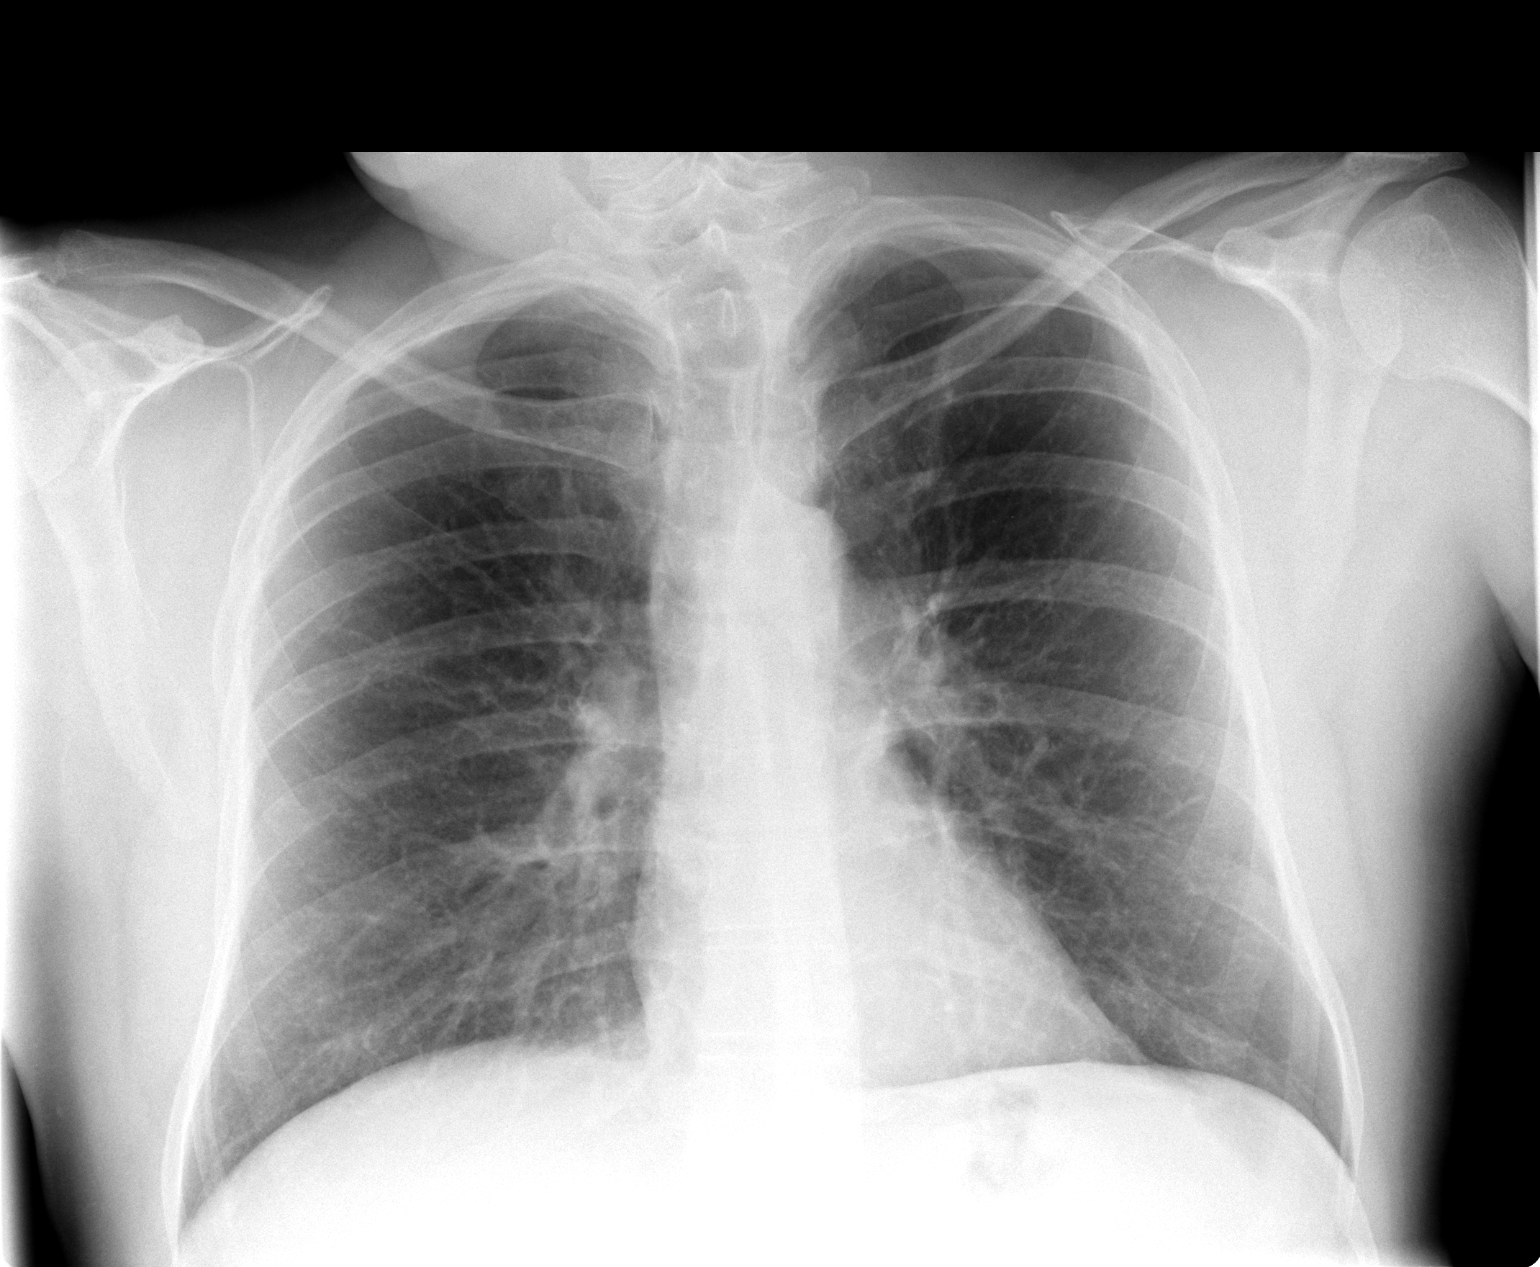

[view not recorded (2 of 2)]
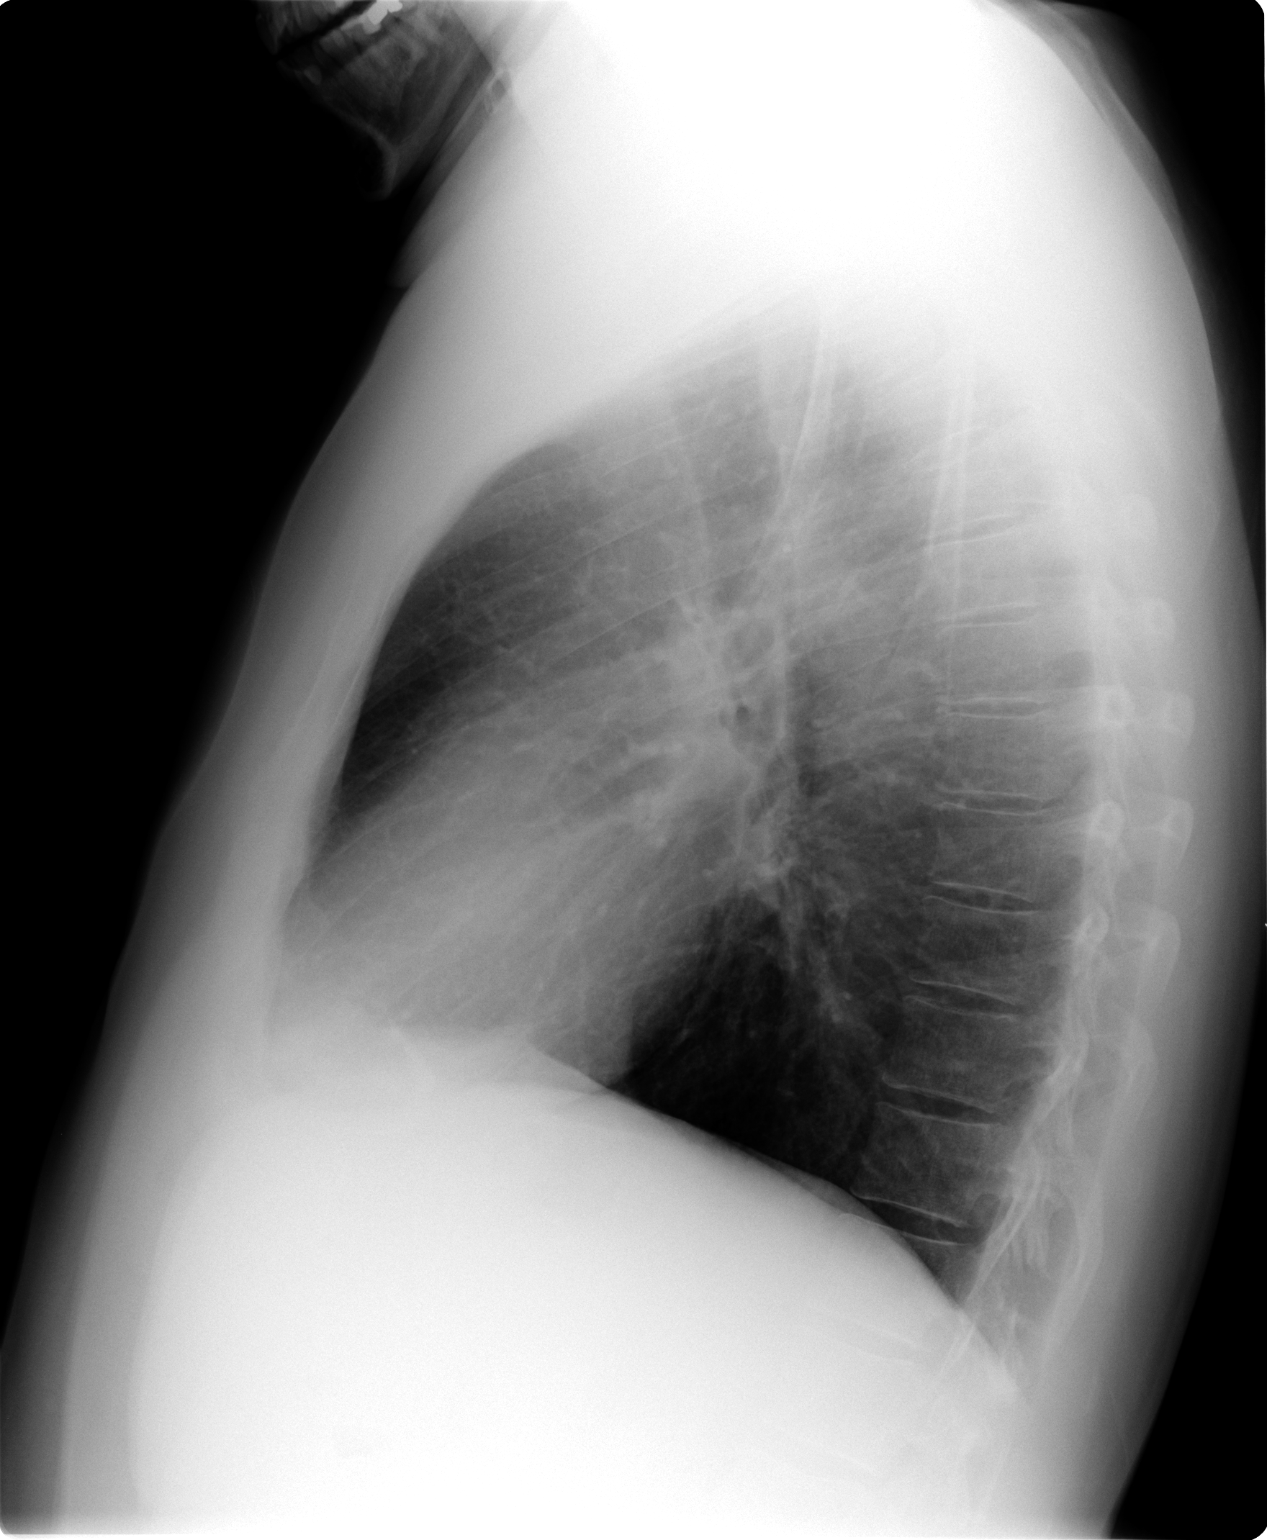

[2 of 2 positions shown; findings below may reference images not displayed]

FINDINGS: The cardiomediastinal silhouette is unremarkable.
The lungs are clear.
COPD/emphysema noted.
There is no evidence of focal airspace disease, pulmonary edema,
suspicious pulmonary nodule/mass, pleural effusion, or
pneumothorax.
No acute bony abnormalities are identified.
IMPRESSION: No evidence of active cardiopulmonary disease.

COPD/emphysema.

## 2014-09-26 ENCOUNTER — Encounter: Payer: Self-pay | Admitting: *Deleted

## 2014-09-26 ENCOUNTER — Emergency Department (INDEPENDENT_AMBULATORY_CARE_PROVIDER_SITE_OTHER)
Admission: EM | Admit: 2014-09-26 | Discharge: 2014-09-26 | Disposition: A | Discharge status: Home / Self Care | Payer: 59 | Source: Home / Self Care | Attending: Family Medicine | Admitting: Family Medicine

## 2014-09-26 DIAGNOSIS — J209 Acute bronchitis, unspecified: Secondary | ICD-10-CM

## 2014-09-26 MED ORDER — PREDNISONE 20 MG PO TABS: 20.0000 mg | ORAL_TABLET | Freq: Two times a day (BID) | ORAL | Status: AC

## 2014-09-26 MED ORDER — BENZONATATE 200 MG PO CAPS: 200.0000 mg | ORAL_CAPSULE | Freq: Every day | ORAL | Status: AC

## 2014-09-26 MED ORDER — AZITHROMYCIN 250 MG PO TABS: ORAL_TABLET | ORAL | Status: AC

## 2014-09-26 NOTE — Discharge Instructions (Signed)
Take plain guaifenesin 1200mg  (Mucinex) twice daily, with plenty of water, for cough and congestion.  May add Pseudoephedrine for sinus congestion.  Get adequate rest.   May use Afrin nasal spray (or generic oxymetazoline) twice daily for about 5 days.  Also recommend using saline nasal spray several times daily and saline nasal irrigation (AYR is a common brand) Try warm salt water gargles for sore throat.  Stop all antihistamines for now, and other non-prescription cough/cold preparations.   Follow-up with family doctor if not improving about one week.

## 2014-09-26 NOTE — ED Provider Notes (Signed)
CSN: [id number]     Arrival date & time 09/26/14  1819 History   None    Chief Complaint  Patient presents with  . Cough      HPI Comments: Patient complains of two day history of typical cold-like symptoms including mild sore throat, sinus congestion, headache, fatigue, and cough.  He has developed wheezing and shortness of breath with activity. He continues to smoke.  The history is provided by the patient.    Past Medical History  Diagnosis Date  . IBS (irritable bowel syndrome)    Past Surgical History  Procedure Laterality Date  . Hernia repair    . Vasectomy    . Hemroidectomy     History reviewed. No pertinent family history. History  Substance Use Topics  . Smoking status: Current Every Day Smoker -- 1.00 packs/day for 25 years    Types: Cigarettes  . Smokeless tobacco: Not on file  . Alcohol Use: No    Review of Systems + sore throat + cough + sneezing No pleuritic pain, but complains of burning in his anterior chest + wheezing + nasal congestion + post-nasal drainage No sinus pain/pressure No itchy/red eyes No earache No hemoptysis + SOB + fever, + chills No nausea No vomiting No abdominal pain No diarrhea No urinary symptoms No skin rash + fatigue + myalgias + headache Used OTC meds without relief  Allergies  Percocet  Home Medications   Prior to Admission medications   Medication Sig Start Date End Date Taking? Authorizing Provider  Cholecalciferol (VITAMIN D PO) Take by mouth.   Yes Historical Provider, MD  Omega-3 Fatty Acids (FISH OIL PO) Take by mouth.   Yes Historical Provider, MD  vitamin E 100 UNIT capsule Take by mouth daily.   Yes Historical Provider, MD  azithromycin (ZITHROMAX Z-PAK) 250 MG tablet Take 2 tabs today; then begin one tab once daily for 4 more days. 09/26/14   Lattie HawStephen A Estalene Bergey, MD  benzonatate (TESSALON) 200 MG capsule Take 1 capsule (200 mg total) by mouth at bedtime. Take as needed for cough 09/26/14   Lattie HawStephen A  Ranveer Wahlstrom, MD  cyclobenzaprine (FLEXERIL) 10 MG tablet Take one tab by mouth daily at bedtime for muscle pain 07/08/11   Lattie HawStephen A Lionel Woodberry, MD  dicyclomine (BENTYL) 10 MG capsule Take 10 mg by mouth 4 (four) times daily -  before meals and at bedtime.      Historical Provider, MD  doxycycline (VIBRAMYCIN) 100 MG capsule Take 1 capsule (100 mg total) by mouth 2 (two) times daily. 11/29/12   Lattie HawStephen A Madisan Bice, MD  fluticasone (FLONASE) 50 MCG/ACT nasal spray Place 2 sprays into the nose daily. 11/29/12   Lattie HawStephen A Josefita Weissmann, MD  loperamide (IMODIUM) 2 MG capsule Take 2 mg by mouth 4 (four) times daily as needed.      Historical Provider, MD  meloxicam (MOBIC) 15 MG tablet One tab PO qAM with breakfast for 2 weeks, then daily prn pain. 06/30/12   Monica Bectonhomas J Thekkekandam, MD  omeprazole (PRILOSEC) 40 MG capsule Take 40 mg by mouth daily.      Historical Provider, MD  predniSONE (DELTASONE) 20 MG tablet Take 1 tablet (20 mg total) by mouth 2 (two) times daily. Take with food. 09/26/14   Lattie HawStephen A Gildardo Tickner, MD   BP 128/81 mmHg  Pulse 81  Temp(Src) 98.7 F (37.1 C) (Oral)  Resp 18  Ht 5\' 8"  (1.727 m)  Wt 188 lb (85.276 kg)  BMI 28.59 kg/m2  SpO2  97% Physical Exam Nursing notes and Vital Signs reviewed. Appearance:  Patient appears stated age, and in no acute distress Eyes:  Pupils are equal, round, and reactive to light and accomodation.  Extraocular movement is intact.  Conjunctivae are not inflamed  Ears:  Canals normal.  Tympanic membranes normal.  Nose:  Mildly congested turbinates.  No sinus tenderness.  Pharynx:  Normal Neck:  Supple.   Tender enlarged posterior nodes are palpated bilaterally  Lungs:   Faint bilateral expiratory wheezes present.  Breath sounds are equal.  Heart:  Regular rate and rhythm without murmurs, rubs, or gallops.  Abdomen:  Nontender without masses or hepatosplenomegaly.  Bowel sounds are present.  No CVA or flank tenderness.  Extremities:  No edema.  No calf tenderness Skin:  No  rash present.   ED Course  Procedures  none     MDM   1. Acute bronchitis, unspecified organism    Begin Z-pack to cover atypicals, and prednisone burst.  Prescription written for Benzonatate (Tessalon) to take at bedtime for night-time cough.  Take plain guaifenesin  (Mucinex) twice daily, with plenty of water, for cough and congestion.  May add Pseudoephedrine for sinus congestion.  Get adequate rest.   May use Afrin nasal spray (or generic oxymetazoline) twice daily for about 5 days.  Also recommend using saline nasal spray several times daily and saline nasal irrigation (AYR is a common brand) Try warm salt water gargles for sore throat.  Stop all antihistamines for now, and other non-prescription cough/cold preparations.   Follow-up with family doctor if not improving about one week.    Lattie Haw, MD 10/02/14 [phone]

## 2014-09-26 NOTE — ED Notes (Signed)
Pt c/o nonproductive cough, chest hurts, SOB, and fever x 2 days.

## 2022-04-06 ENCOUNTER — Other Ambulatory Visit: Payer: Self-pay | Admitting: Urology

## 2022-04-06 DIAGNOSIS — N400 Enlarged prostate without lower urinary tract symptoms: Secondary | ICD-10-CM

## 2022-04-09 ENCOUNTER — Ambulatory Visit: Payer: 59 | Admitting: Urology

## 2022-04-17 ENCOUNTER — Telehealth: Payer: 59
